# Patient Record
Sex: Female | Born: 1985 | Race: White | Hispanic: Yes | Marital: Single | Smoking: Never smoker
Health system: Southern US, Community
[De-identification: ages and names within clinical notes are randomized; demographics above are authoritative.]

---
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
## 2008-12-04 ENCOUNTER — Ambulatory Visit (HOSPITAL_COMMUNITY): Admission: RE | Admit: 2008-12-04 | Discharge: 2008-12-04 | Payer: Self-pay | Admitting: Family Medicine

## 2009-01-08 ENCOUNTER — Ambulatory Visit (HOSPITAL_COMMUNITY): Admission: RE | Admit: 2009-01-08 | Discharge: 2009-01-08 | Payer: Self-pay | Admitting: Family Medicine

## 2009-01-22 ENCOUNTER — Ambulatory Visit (HOSPITAL_COMMUNITY): Admission: RE | Admit: 2009-01-22 | Discharge: 2009-01-22 | Payer: Self-pay | Admitting: Family Medicine

## 2009-03-05 ENCOUNTER — Ambulatory Visit (HOSPITAL_COMMUNITY): Admission: RE | Admit: 2009-03-05 | Discharge: 2009-03-05 | Payer: Self-pay | Admitting: Family Medicine

## 2009-04-21 IMAGING — US US OB NUCHAL TRANSLUCENCY 1ST GEST
1 series · 14 of 19 positions shown · non-contrast
Comparison: none

OBSTETRICAL ULTRASOUND:
 This ultrasound was performed in The [HOSPITAL], and the [facility] report will be stored to [REDACTED] PACS.

[Series 1: us ob nuchal translucency 1st gest · 14 of 19 slices shown]
[im 1/19]
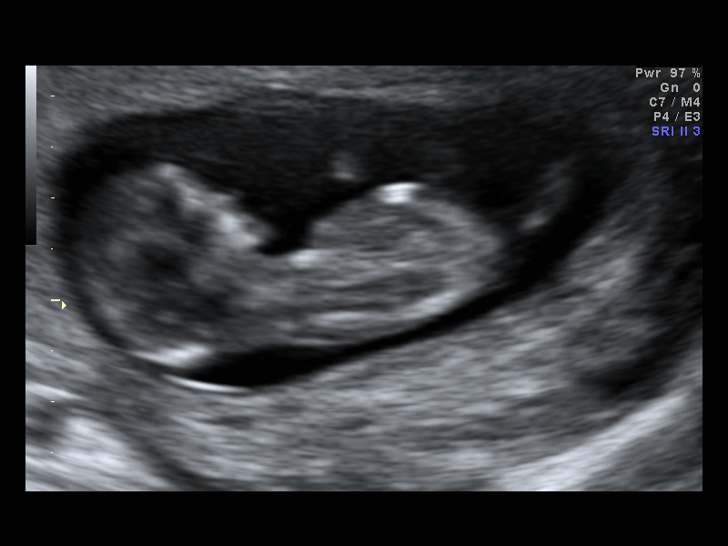
[im 3/19]
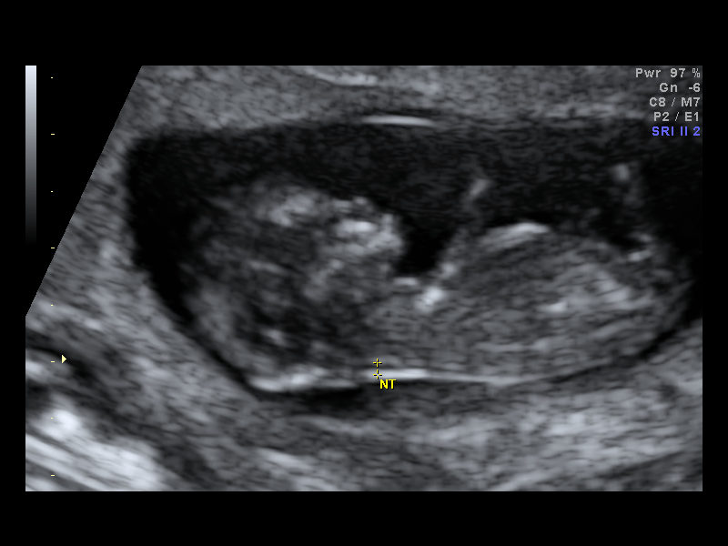
[im 4/19]
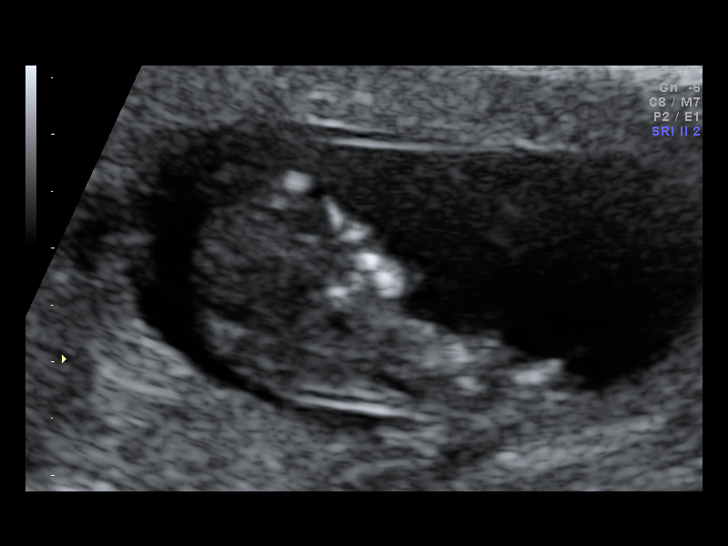
[im 5/19]
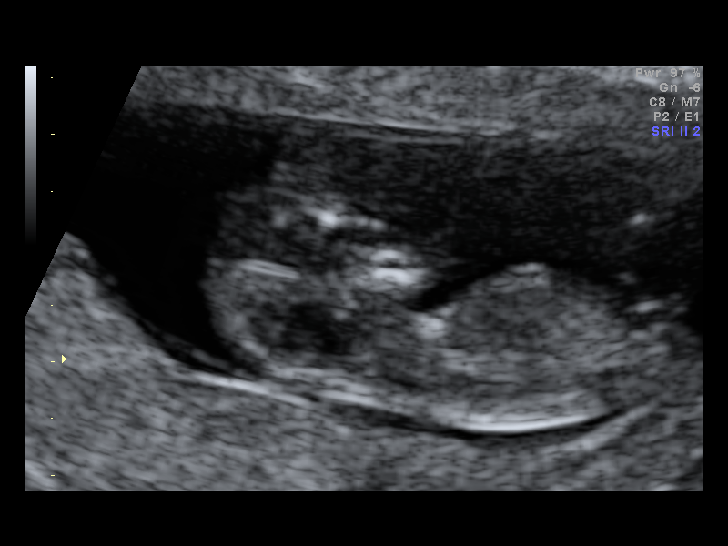
[im 7/19]
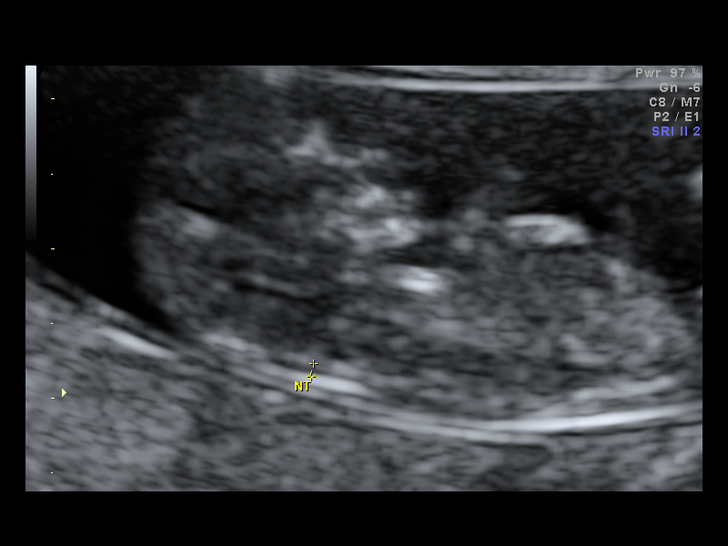
[im 8/19]
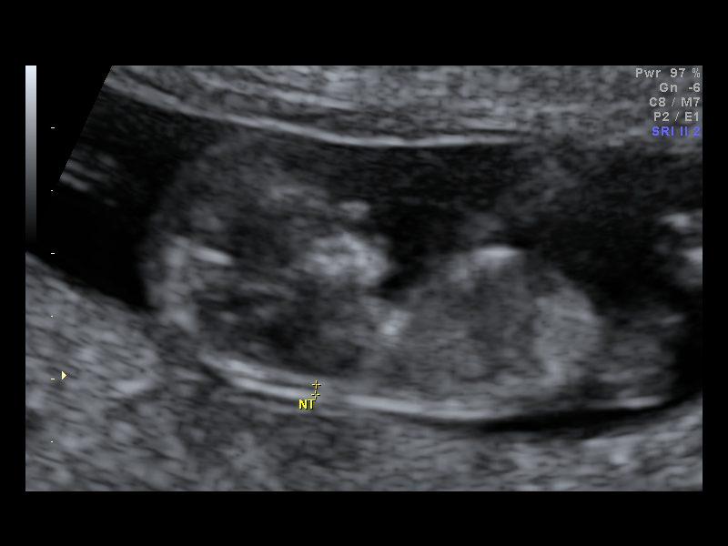
[im 9/19]
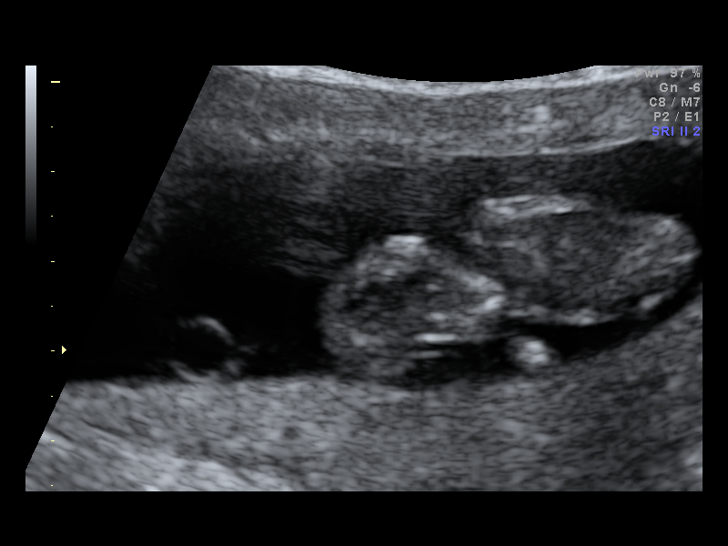
[im 11/19]
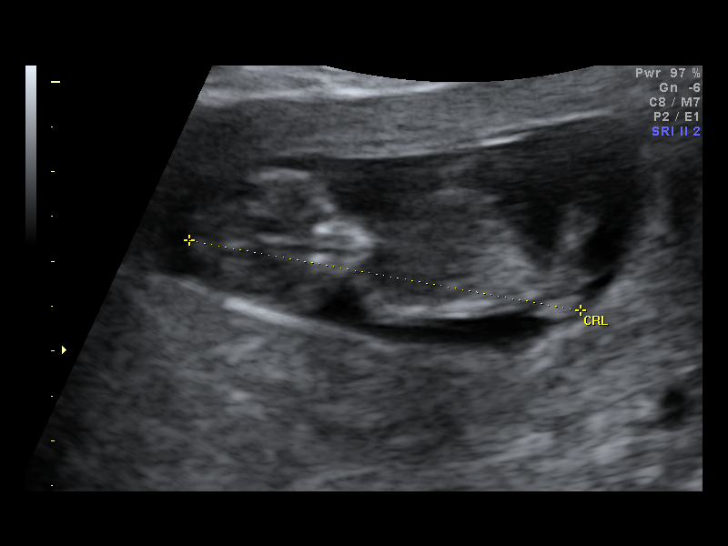
[im 12/19]
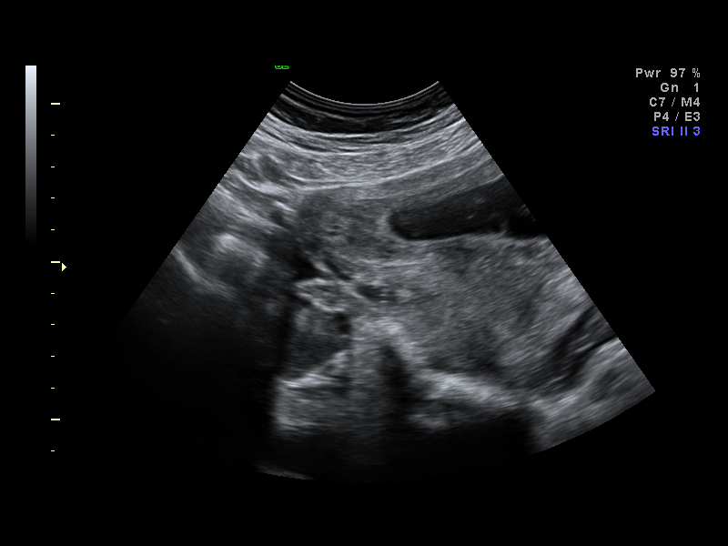
[im 13/19]
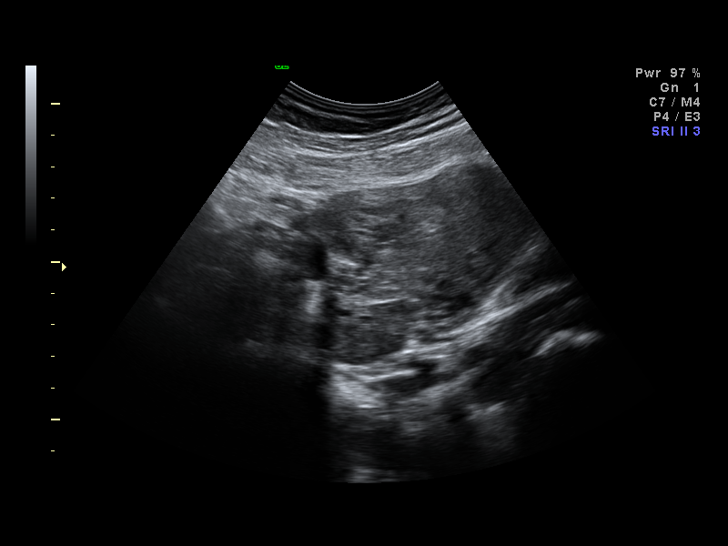
[im 15/19]
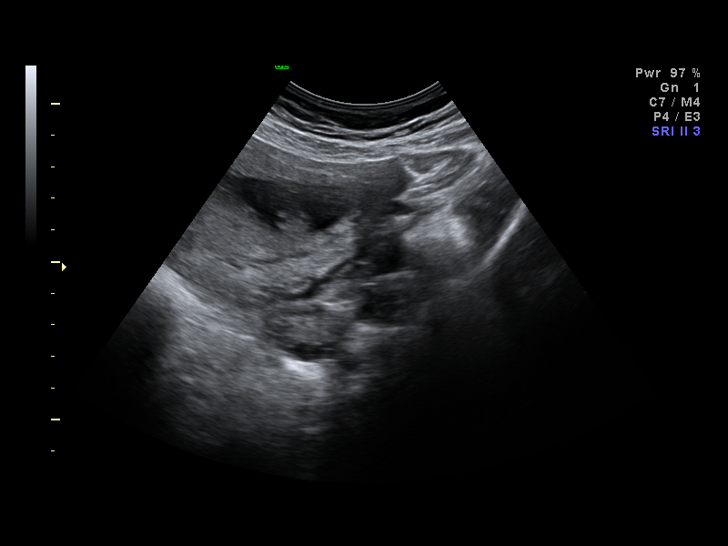
[im 16/19]
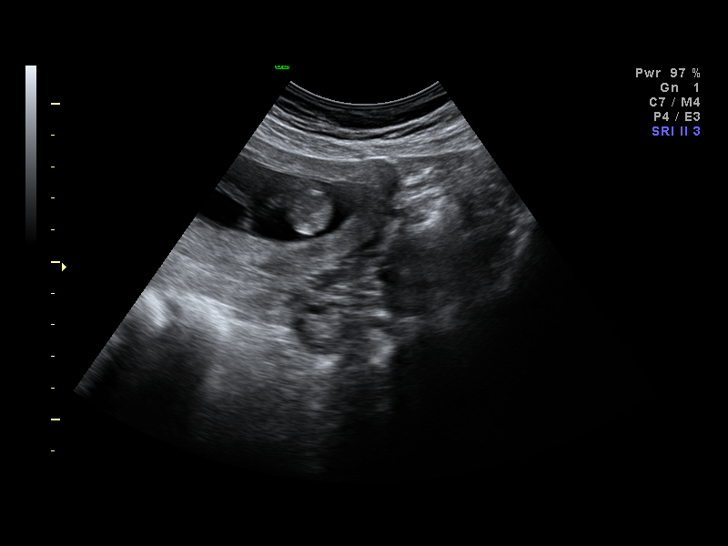
[im 17/19]
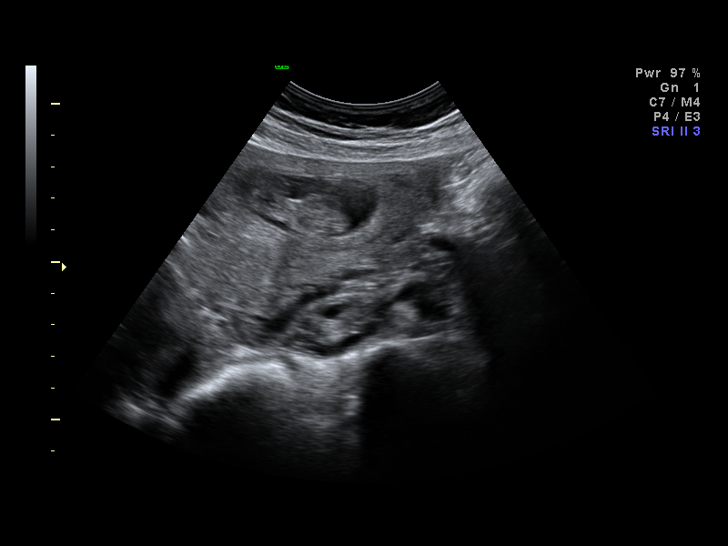
[im 19/19]
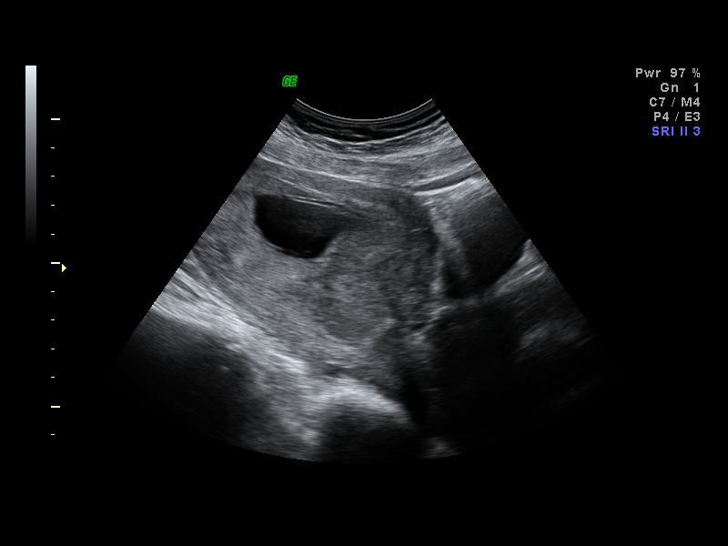

[14 of 19 positions shown; findings below may reference images not displayed]

IMPRESSION: [facility] has also been faxed to the ordering physician.

## 2009-06-26 ENCOUNTER — Inpatient Hospital Stay (HOSPITAL_COMMUNITY): Admission: AD | Admit: 2009-06-26 | Discharge: 2009-06-26 | Payer: Self-pay | Admitting: Family Medicine

## 2009-06-27 ENCOUNTER — Inpatient Hospital Stay (HOSPITAL_COMMUNITY): Admission: AD | Admit: 2009-06-27 | Discharge: 2009-06-28 | Payer: Self-pay | Admitting: Obstetrics & Gynecology

## 2009-06-27 ENCOUNTER — Ambulatory Visit: Payer: Self-pay | Admitting: Advanced Practice Midwife

## 2011-02-19 LAB — CBC
HCT: 32.7 % — ABNORMAL LOW (ref 36.0–46.0)
Hemoglobin: 11.3 g/dL — ABNORMAL LOW (ref 12.0–15.0)
MCHC: 34.6 g/dL (ref 30.0–36.0)
RBC: 3.79 MIL/uL — ABNORMAL LOW (ref 3.87–5.11)
RDW: 15.2 % (ref 11.5–15.5)

## 2011-03-11 ENCOUNTER — Emergency Department (HOSPITAL_COMMUNITY)
Admission: EM | Admit: 2011-03-11 | Discharge: 2011-03-11 | Disposition: A | Discharge status: Home / Self Care | Payer: Self-pay | Attending: Emergency Medicine | Admitting: Emergency Medicine

## 2011-03-11 DIAGNOSIS — R002 Palpitations: Secondary | ICD-10-CM | POA: Insufficient documentation

## 2014-06-10 ENCOUNTER — Emergency Department (HOSPITAL_COMMUNITY)
Admission: EM | Admit: 2014-06-10 | Discharge: 2014-06-10 | Disposition: A | Discharge status: Home / Self Care | Payer: Self-pay | Attending: Emergency Medicine | Admitting: Emergency Medicine

## 2014-06-10 ENCOUNTER — Encounter (HOSPITAL_COMMUNITY): Payer: Self-pay | Admitting: Emergency Medicine

## 2014-06-10 DIAGNOSIS — B373 Candidiasis of vulva and vagina: Secondary | ICD-10-CM

## 2014-06-10 DIAGNOSIS — L293 Anogenital pruritus, unspecified: Secondary | ICD-10-CM | POA: Insufficient documentation

## 2014-06-10 DIAGNOSIS — Z3202 Encounter for pregnancy test, result negative: Secondary | ICD-10-CM | POA: Insufficient documentation

## 2014-06-10 DIAGNOSIS — B3731 Acute candidiasis of vulva and vagina: Secondary | ICD-10-CM | POA: Insufficient documentation

## 2014-06-10 LAB — URINALYSIS, ROUTINE W REFLEX MICROSCOPIC
BILIRUBIN URINE: NEGATIVE
Glucose, UA: NEGATIVE mg/dL
Ketones, ur: NEGATIVE mg/dL
Leukocytes, UA: NEGATIVE
NITRITE: NEGATIVE
Protein, ur: NEGATIVE mg/dL
SPECIFIC GRAVITY, URINE: 1.009 (ref 1.005–1.030)
UROBILINOGEN UA: 0.2 mg/dL (ref 0.0–1.0)
pH: 6.5 (ref 5.0–8.0)

## 2014-06-10 LAB — WET PREP, GENITAL
Clue Cells Wet Prep HPF POC: NONE SEEN
Trich, Wet Prep: NONE SEEN

## 2014-06-10 LAB — URINE MICROSCOPIC-ADD ON

## 2014-06-10 LAB — HIV ANTIBODY (ROUTINE TESTING W REFLEX): HIV: NONREACTIVE

## 2014-06-10 LAB — POC URINE PREG, ED: Preg Test, Ur: NEGATIVE

## 2014-06-10 LAB — RPR

## 2014-06-10 MED ORDER — FLUCONAZOLE 150 MG PO TABS
150.0000 mg | ORAL_TABLET | Freq: Once | ORAL | Status: AC
Start: 2014-06-13 — End: 2014-06-13

## 2014-06-10 MED ORDER — FLUCONAZOLE 100 MG PO TABS
150.0000 mg | ORAL_TABLET | Freq: Once | ORAL | Status: AC
  Administered 2014-06-10: 150 mg via ORAL
  Filled 2014-06-10: qty 2

## 2014-06-10 NOTE — ED Notes (Signed)
She states "my vaginas been itching since yesterday. It burns to pee and i didn't like what i saw when i looked down there. Ive also missed my period for 2 months but i took a pregnancy test at home home that was negative."

## 2014-06-10 NOTE — Discharge Planning (Signed)
[facility] Community Liaison  Spoke to patient regarding primary care resources and establishing primary care. Resource guide and my contact information provided for any future questions or concerns. No other community liaison needs identified.

## 2014-06-10 NOTE — Discharge Instructions (Signed)
Candidal Vulvovaginitis Candidal vulvovaginitis is an infection of the vagina and vulva. The vulva is the skin around the opening of the vagina. This may cause itching and discomfort in and around the vagina.  HOME CARE  Only take medicine as told by your doctor.  Do not have sex (intercourse) until the infection is healed or as told by your doctor.  Practice safe sex.  Tell your sex partner about your infection.  Do not douche or use tampons.  Wear cotton underwear. Do not wear tight pants or panty hose.  Eat yogurt. This may help treat and prevent yeast infections. GET HELP RIGHT AWAY IF:   You have a fever.  Your problems get worse during treatment or do not get better in 3 days.  You have discomfort, irritation, or itching in your vagina or vulva area.  You have pain after sex.  You start to get belly (abdominal) pain. MAKE SURE YOU:  Understand these instructions.  Will watch your condition.  Will get help right away if you are not doing well or get worse. Document Released: 01/27/2009 Document Revised: 11/05/2013 Document Reviewed: 01/27/2009 [facility] Patient Information 2015 [place], [place]. This information is not intended to replace advice given to you by your health care provider. Make sure you discuss any questions you have with your health care provider.   Take one more dose of diflucan as prescribed in 3 days, on Friday.  [facility] further management and treatment of your symptoms.

## 2014-06-10 NOTE — ED Provider Notes (Signed)
CSN: [id number]     Arrival date & time 06/10/14  1014 History   First MD Initiated Contact with Patient 06/10/14 1107     Chief Complaint  Patient presents with  . Vaginal Itching     (Consider location/radiation/quality/duration/timing/severity/associated sxs/prior Treatment) The history is provided by the patient.    Paula Henry is a 28 y.o. female presenting with vaginal itching and intermittent white discharge since yesterday, although reports she noticed a nonpainful "bump" on her labia about 2 months ago.  She was seen for a similar lesion on her right upper thigh months ago and was told it was from friction from her pants and has since resolved. She denies fevers or chills, nausea, vomiting and abdominal or pelvic pain.  She is sexually active with one person, unprotected.  He has no symptoms.  She also reports her LMP ws 2 months ago.  A home pregnancy test was negative last week.     History reviewed. No pertinent past medical history. History reviewed. No pertinent past surgical history. History reviewed. No pertinent family history. History  Substance Use Topics  . Smoking status: Never Smoker   . Smokeless tobacco: Not on file  . Alcohol Use: Yes   OB History   Grav Para Term Preterm Abortions TAB SAB Ect Mult Living                 Review of Systems  Constitutional: Negative for fever.  HENT: Negative for congestion and sore throat.   Eyes: Negative.   Respiratory: Negative for chest tightness and shortness of breath.   Cardiovascular: Negative for chest pain.  Gastrointestinal: Negative for nausea and abdominal pain.  Genitourinary: Positive for vaginal discharge and genital sores. Negative for dysuria, frequency, hematuria, flank pain, decreased urine volume and pelvic pain.  Musculoskeletal: Negative for arthralgias, joint swelling and neck pain.  Skin: Negative.  Negative for rash and wound.  Neurological: Negative for dizziness, weakness,  light-headedness, numbness and headaches.  Psychiatric/Behavioral: Negative.       Allergies  Review of patient's allergies indicates no known allergies.  Home Medications   Prior to Admission medications   Medication Sig Start Date End Date Taking? Authorizing Provider  OVER THE COUNTER MEDICATION Take 1 capsule by mouth daily. For constipation   Yes Historical Provider, MD   BP 132/74  Pulse 72  Temp(Src) 98.1 F (36.7 C) (Oral)  Resp 20  SpO2 99% Physical Exam  Nursing note and vitals reviewed. Constitutional: She appears well-developed and well-nourished.  HENT:  Head: Normocephalic and atraumatic.  Eyes: Conjunctivae are normal.  Neck: Normal range of motion.  Cardiovascular: Normal rate, regular rhythm, normal heart sounds and intact distal pulses.   Pulmonary/Chest: Effort normal and breath sounds normal. She has no wheezes.  Abdominal: Soft. Bowel sounds are normal. There is no tenderness.  Genitourinary: Cervix exhibits no motion tenderness, no discharge and no friability. Right adnexum displays no mass, no tenderness and no fullness. Left adnexum displays no mass, no tenderness and no fullness. Vaginal discharge found.  Pt has a rugal like pattern to the introitus at the posterior fornix.  No rash or lesions, this area is non tender and is not easily friable.  Scattered old appearing folliculitis type lesions on mons without pustules, no cellulitis.  Musculoskeletal: Normal range of motion.  Neurological: She is alert.  Skin: Skin is warm and dry.  Psychiatric: She has a normal mood and affect.    ED Course  Procedures (including critical care  time) Labs Review Labs Reviewed  WET PREP, GENITAL - Abnormal; Notable for the following:    Yeast Wet Prep HPF POC FEW (*)    WBC, Wet Prep HPF POC TOO NUMEROUS TO COUNT (*)    All other components within normal limits  URINALYSIS, ROUTINE W REFLEX MICROSCOPIC - Abnormal; Notable for the following:    Hgb urine  dipstick SMALL (*)    All other components within normal limits  URINE MICROSCOPIC-ADD ON - Abnormal; Notable for the following:    Bacteria, UA FEW (*)    All other components within normal limits  GC/CHLAMYDIA PROBE AMP  RPR  HIV ANTIBODY (ROUTINE TESTING)  POC URINE PREG, ED    Imaging Review No results found.   EKG Interpretation None      MDM   Final diagnoses:  Yeast vaginitis    Patients labs and/or radiological studies were viewed and considered during the medical decision making and disposition process. Pt was treated with diflucan for yeast.  Discussed pending labs, pt will defer further tx until labs result.  No exam findings to suggest cervicitis or PID.      Burgess AmorJulie Suhas Estis, PA-C 06/10/14 1359

## 2014-06-10 NOTE — ED Provider Notes (Signed)
Medical screening examination/treatment/procedure(s) were performed by non-physician practitioner and as supervising physician I was immediately available for consultation/collaboration.   EKG Interpretation None        Gilda Creasehristopher J. Pollina, MD 06/10/14 1401

## 2014-06-11 LAB — GC/CHLAMYDIA PROBE AMP
CT PROBE, AMP APTIMA: NEGATIVE
GC Probe RNA: NEGATIVE

## 2014-07-23 ENCOUNTER — Encounter: Payer: Self-pay | Admitting: Obstetrics and Gynecology

## 2014-07-23 ENCOUNTER — Ambulatory Visit (INDEPENDENT_AMBULATORY_CARE_PROVIDER_SITE_OTHER): Payer: Self-pay | Admitting: Obstetrics and Gynecology

## 2014-07-23 VITALS — BP 113/77 | HR 86 | Temp 98.3°F | Wt 149.7 lb

## 2014-07-23 DIAGNOSIS — L731 Pseudofolliculitis barbae: Secondary | ICD-10-CM

## 2014-07-23 DIAGNOSIS — L738 Other specified follicular disorders: Secondary | ICD-10-CM

## 2014-07-23 LAB — POCT PREGNANCY, URINE: Preg Test, Ur: NEGATIVE

## 2014-07-23 MED ORDER — NORGESTIMATE-ETH ESTRADIOL 0.25-35 MG-MCG PO TABS: 1.0000 | ORAL_TABLET | Freq: Every day | ORAL | Status: AC

## 2014-07-23 NOTE — Progress Notes (Signed)
Pt has not had a period for the past 3 months, is unsure if she is pregnant.  Uses no birth control.  Collected urine for pregnancy test if needed.

## 2014-07-23 NOTE — Progress Notes (Signed)
Patient ID: Paula Henry, female   DOB: [birth year], 28 y.o.   MRN: [id number] 28 yo G2P2 presenting today for the evaluation of vulva lesions. Patient reports noticing tiny bumps on her vulva over the past 2 months. They are not painful or pruritic. They are self resolving. Patient would like to make sure there is nothing wrong with her. Patient states there is a single lesion present at this time superior to her urethra  History reviewed. No pertinent past medical history. No past surgical history on file. No family history on file. History  Substance Use Topics  . Smoking status: Never Smoker   . Smokeless tobacco: Not on file  . Alcohol Use: Yes    GENERAL: Well-developed, well-nourished female in no acute distress.  ABDOMEN: Soft, nontender, nondistended. No organomegaly. PELVIC: Normal external female genitalia. Singular 2 mm pustular lesion consistent with an ingrown hair follicle. Vagina is pink and rugated.  Normal discharge. Normal appearing cervix. Uterus is normal in size. No adnexal mass or tenderness. EXTREMITIES: No cyanosis, clubbing, or edema, 2+ distal pulses.  A/P 28 yo with ingrown hair follicle - Reassurance provided. Advised to avoid shaving the area - Patient reports history of irregular cycles which needed to be controlled with OCP. Patient desires to be restarted on OCP RX Sprintec provided - RTC prn
